# Patient Record
Sex: Male | Born: 1955 | Race: White | Hispanic: No | Marital: Married
Health system: Southern US, Community
[De-identification: ages and names within clinical notes are randomized; demographics above are authoritative.]

---

## 2006-09-03 ENCOUNTER — Ambulatory Visit: Payer: Self-pay

## 2006-11-20 ENCOUNTER — Ambulatory Visit: Payer: Self-pay | Admitting: Family Medicine

## 2009-01-27 ENCOUNTER — Emergency Department: Payer: Self-pay | Admitting: Emergency Medicine

## 2009-04-14 ENCOUNTER — Ambulatory Visit: Payer: Self-pay | Admitting: General Practice

## 2010-09-30 ENCOUNTER — Emergency Department: Payer: Self-pay | Admitting: Emergency Medicine

## 2014-02-02 ENCOUNTER — Ambulatory Visit: Payer: Self-pay | Admitting: Family Medicine

## 2014-06-18 ENCOUNTER — Ambulatory Visit: Payer: Self-pay | Admitting: Anesthesiology

## 2014-06-22 ENCOUNTER — Ambulatory Visit: Payer: Self-pay | Admitting: Orthopedic Surgery

## 2014-10-03 NOTE — Op Note (Signed)
PATIENT NAME:  Brandon Oconnor, Brandon Oconnor MR#:  [id number] DATE OF BIRTH:  07/17/1955  DATE OF PROCEDURE:  06/22/2014  PREOPERATIVE DIAGNOSIS: Left carpal tunnel syndrome.   POSTOPERATIVE DIAGNOSIS: Left carpal tunnel syndrome.   PROCEDURE: Left carpal tunnel release.   ANESTHESIA: General.   SURGEON: Kennedy BuckerMichael Saveah Bahar, MD   DESCRIPTION OF PROCEDURE: The patient was brought to the operating room and after adequate anesthesia was obtained, the left arm was prepped and draped in the usual sterile fashion with a tourniquet applied to the upper arm. After patient identification and timeout procedures were completed, tourniquet was raised to 250 mmHg. An incision was made in line with the ring metacarpal starting at the distal wrist flexion crease approximately 200 cm in length. The skin and subcutaneous tissue were separated and there were some thenar musculature overlying the transverse carpal ligament. This was elevated and the transverse carpal ligament incised with a vascular hemostat to protect the underlying structure. Release was carried out distally and then proximally. There was some hourglass constriction in the midportion of the canal, consistent with pressure on the median nerve from the transverse carpal ligament. There was also moderate flexor tenosynovitis. After release, there were no masses noted. There is good vascular blush to the nerve. The wound was irrigated and then closed with simple interrupted 4-0 nylon, after infiltration of the wound with 10 mL 0.5% Sensorcaine without epinephrine to aid in postoperative analgesia. Sterile dressing of Xeroform, 4 x 4's, Webril and an Ace wrap were applied. The patient tolerated the procedure well, sent to recovery room in stable condition.   ESTIMATED BLOOD LOSS: Minimal.   COMPLICATIONS: None.   TOURNIQUET TIME: Fourteen minutes at 250 mmHg.   ____________________________ Leitha SchullerMichael J. Aryn Kops, MD mjm:TT D: 06/22/2014 19:50:21 ET T: 06/22/2014 22:51:14  ET JOB#: [id number]  cc: Leitha SchullerMichael J. Lanique Gonzalo, MD, <Dictator> Leitha SchullerMICHAEL J Malayja Freund MD ELECTRONICALLY SIGNED 06/23/2014 0:04

## 2014-11-15 ENCOUNTER — Other Ambulatory Visit: Payer: Self-pay | Admitting: Family Medicine

## 2014-11-15 MED ORDER — OMEPRAZOLE 20 MG PO CPDR
20.0000 mg | DELAYED_RELEASE_CAPSULE | Freq: Every day | ORAL | Status: AC
Start: 2014-11-15 — End: ?

## 2015-01-03 DEATH — deceased

## 2015-02-10 ENCOUNTER — Ambulatory Visit: Payer: Self-pay | Admitting: Family Medicine

## 2015-12-06 IMAGING — NM NM THYROID IMAGING W/ UPTAKE SINGLE (24 HR)
2 series · 2 of 2 positions shown · non-contrast
Comparison: None

CLINICAL DATA: Abnormal TSH

EXAM:
THYROID SCAN AND UPTAKE - 4 AND 24 HOURS
TECHNIQUE: Following the per oral administration of W-VKV sodium iodide, the
patient returned at 4 and 24 hours and uptake measurements were
acquired with the uptake probe centered on the neck. Thyroid imaging
was performed following the intravenous administration of Tc-99m
Pertechnetate.
RADIOPHARMACEUTICALS:  146.77 uCi E-KT6 sodium iodide orally

[Series 1000: thyroid save_screens · 1 of 1 slices shown]
[im 1/1]
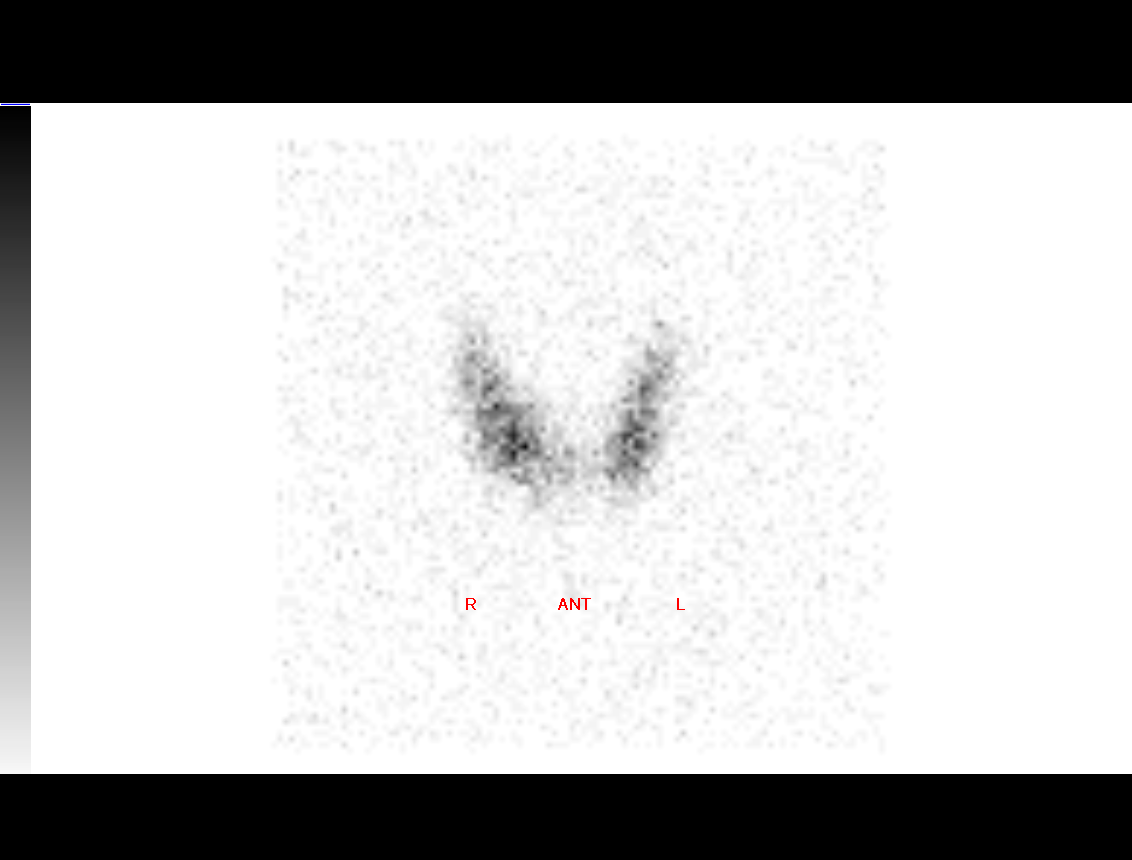

[Series 1000: (id) thyroid scan · 2.40mm/px · 1 of 1 slices shown]
[im 1/1  full-range]
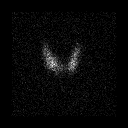

[2 of 2 positions shown; findings below may reference images not displayed]

FINDINGS: 6 hr radio iodine uptake calculated at 3.1%, slightly below the
normal range.

24 hr radio iodine uptake calculated at 7.4%, at the lower end of
the normal range.

Anterior view of the thyroid gland demonstrates symmetric
homogeneous tracer distribution in both thyroid lobes, though
overall uptake is low.

No focal areas of increased or decreased tracer localization seen.
IMPRESSION: Low normal 24 hr radio iodine uptake of 7.4%.

Normal limited thyroid scan.
